# Patient Record
Sex: Male | Born: 2000 | Race: Black or African American | Hispanic: No | Marital: Single
Health system: Southern US, Community
[De-identification: ages and names within clinical notes are randomized; demographics above are authoritative.]

---
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
## 2021-03-22 ENCOUNTER — Ambulatory Visit: Payer: Self-pay | Admitting: Family Medicine

## 2021-03-23 ENCOUNTER — Other Ambulatory Visit: Payer: Self-pay

## 2021-03-23 ENCOUNTER — Ambulatory Visit (INDEPENDENT_AMBULATORY_CARE_PROVIDER_SITE_OTHER): Payer: Self-pay | Admitting: Family Medicine

## 2021-03-23 VITALS — BP 122/84 | HR 77 | Ht 72.0 in | Wt 175.4 lb

## 2021-03-23 DIAGNOSIS — Z025 Encounter for examination for participation in sport: Secondary | ICD-10-CM

## 2021-03-23 DIAGNOSIS — Z130 Encounter for screening for diseases of the blood and blood-forming organs and certain disorders involving the immune mechanism: Secondary | ICD-10-CM

## 2021-03-23 NOTE — Patient Instructions (Signed)
Thank you for coming in today.   See if you can find a copy of the sickle cell screen test. I need a copy of the test result.  If you need the test get it done tomorrow.  Sign up for mychart. The rests of the test will come directly to you that way.

## 2021-03-23 NOTE — Progress Notes (Signed)
Mark Mckinney presents to clinic today for a sports physical in preparation for college basketball.  He is a rising sophomore at [employer].  He played basketball last year successfully.  He feels well with no issues.  Of note the paperwork he has today requires sickle cell screen test to be completed.  He notes that he is thinks he has been tested for sickle cell but cannot recall where or provide paperwork.  He thinks the paperwork is at the school but is not sure.  Physical exam today was normal.  Screening questionnaire was normal.  Vitals:   03/23/21 1323  BP: 122/84  Pulse: 77    Patient is cleared to participate in sports.  I have ordered a sickle cell screening test and patient can get it done if he needs to before he returns to school.
